# Patient Record
Sex: Male | Born: 1955
Health system: Western US, Academic
[De-identification: ages and names within clinical notes are randomized; demographics above are authoritative.]

---

## 2016-11-14 ENCOUNTER — Inpatient Hospital Stay: Payer: BLUE CROSS/BLUE SHIELD

## 2016-11-14 ENCOUNTER — Telehealth: Payer: BLUE CROSS/BLUE SHIELD

## 2016-11-14 DIAGNOSIS — R55 Syncope and collapse: Secondary | ICD-10-CM

## 2016-11-14 DIAGNOSIS — R030 Elevated blood-pressure reading, without diagnosis of hypertension: Secondary | ICD-10-CM

## 2016-11-14 DIAGNOSIS — R001 Bradycardia, unspecified: Secondary | ICD-10-CM

## 2016-12-19 ENCOUNTER — Ambulatory Visit: Payer: BLUE CROSS/BLUE SHIELD

## 2016-12-25 ENCOUNTER — Ambulatory Visit: Payer: BLUE CROSS/BLUE SHIELD

## 2016-12-25 DIAGNOSIS — E7800 Pure hypercholesterolemia, unspecified: Secondary | ICD-10-CM

## 2016-12-25 DIAGNOSIS — K219 Gastro-esophageal reflux disease without esophagitis: Secondary | ICD-10-CM

## 2016-12-25 DIAGNOSIS — R030 Elevated blood-pressure reading, without diagnosis of hypertension: Secondary | ICD-10-CM

## 2016-12-25 DIAGNOSIS — I5189 Other ill-defined heart diseases: Secondary | ICD-10-CM

## 2016-12-25 DIAGNOSIS — R001 Bradycardia, unspecified: Secondary | ICD-10-CM

## 2016-12-25 DIAGNOSIS — R55 Syncope and collapse: Secondary | ICD-10-CM

## 2018-08-31 ENCOUNTER — Telehealth: Payer: BLUE CROSS/BLUE SHIELD

## 2018-08-31 NOTE — Telephone Encounter
Referred by:     MD Name:  Self   MD Specialty:     Phone:      Diagnosis (must be confirmed by an MD, not per patient):  Essential tremors ( left hand)       Symptoms:  No other symptoms       Diagnostic or Miscellaneous Tests and Date(s) related to this diagnosis:  Does not remember date about 3 years .       Previous Chemo/Radiation Therapy:    []  Yes  [x]  No      Previous Surgery: and Date(s)     []  Yes  ? Date(s):  [x]  No       Medical records related to this diagnosis (both report and CD of imaging required):  (Dr. Joaquim Lai???s office to be excluded from this workflow.)    If appt is within 72 hours -    []  Pt will hand-carry medical records and CD to their appt (they must provide 'copies' as they will not be returned to patient.)     []  Pt will mail medical records and CD (See Clinic Info page for address)    If appt is outside 72 hour window -       []  Pt will e-mail medical records to [email or web address]     []  Pt will mail medical records and CD (See Clinic Info page for address)    []  Pt will deliver medical records and CD to clinic (they must provide 'copies' as they will not be returned to patient.)    []  Pt will send medical records and CD electronically (Patient was provided with the following):    ? Link to [facility] (inform patient that link is only good for 2 days)  ? Check appropriate physician's group, i.e. ''brain tumor group'', ''Pour[facility] group''  ? [employer] fax number [phone].       []  Patient is only established with [facility] and all records are in [facility].    Insurance:   [x]  Insurance verified at time of call in CC.     Authorization required:     []  Yes     []  On file     []  Pending   [x]  No     OTA required:     []  Yes    []  On file    []  Pending   [x]  No     Worker's Comp approval required:     []  Yes    []  On file    []  Pending   [x]  No     []  Insurance not verified at time of call in CC.     []  Pt did not have at time of call; instructed to call back with insurance information.  (inform patient that appt cannot be scheduled until insurance info is obtained and it's  been verified/auth/OTA obtained)   []  Other:    If unable to schedule, select reason:    []  No patient-agreed availability  []  MDs do not support Dx   [x]  Records/Images need MD review   []  Missing records/images  []  Other (Please explain):      Contact person, if other than patient calling  ? Name:    ? Relationship to patient:    ? Phone Number:

## 2018-09-01 NOTE — Telephone Encounter
Pending records to confirm diagnosis.

## 2018-09-23 ENCOUNTER — Ambulatory Visit: Payer: BLUE CROSS/BLUE SHIELD

## 2018-09-23 NOTE — Telephone Encounter
Received neurology initial consult note from visit on 07/24/2014. No confirmed diagnosis of essential tremor or PD. Need notes with confirmed diagnosis. Preferably recent notes, if any.

## 2018-11-18 ENCOUNTER — Ambulatory Visit: Payer: BLUE CROSS/BLUE SHIELD

## 2018-12-12 NOTE — Progress Notes
Neurology New Patient Consult Note    PATIENT:  Frank Schwartz  MRN:  [id number]  DATE: 12/14/2018    Referring Physician: Tye Maryland, MD    Reason for consult:   Chief Complaint   Patient presents with   ??? Tremors     both hands       HPI:   Frank Schwartz is a 63 y.o. right-handed male with  has no past medical history on file.     Frank Schwartz presents to discuss tremor.     -he states he has had tremors since he states in 5th grade.   -he notes tremors started to worsen over past 5-6 years.   -Predominantly in L hand, but also mildly present in R hand. Worse with actions such as holding plates, picking up glasses, bringing spoon/fork to mouth.  Sometimes L hand ''jerks'' randomly when trying to carry something. ETOH seems to relax tremor somewhat. Caffeine seems to exacerbate tremor, as dose lack of sleep, hunger, stress. No rest tremor.    -he has a 78 yr old son and 72 yr old daughter with similar tremors that started in high school.     -Walks, exercises, hikes without limitations. 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 He thinks a few times over the past, has briefly yellout out when waking up from dreams, but no hx of Schwartz persistent or prolonged dream re-enactment behavior. Hx of sleep apnea, wears a mouthpiece.     -Patient saw outside neurologist Dr. 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 Ordered brain MRI. He thinks he had this done, and he thinks was normal.     -He is mainly interested in ultrasound ablation therapy for tremor by [facility] NSG.       Past Medical History  No past medical history on file.  Cardiac diasfunction  Cold urticaria  GERD  Vasovagal syncope 2018, EKG at that time with sinus bradycardia    Past Surgical History  None    Current Medications  Current Outpatient Medications   Medication Sig   ??? Calcium Citrate (CALCITRATE PO) Take by mouth.   ??? desonide 0.05% ointment    ??? Multiple Vitamin (DAILY-VITAMIN PO) Take by mouth. ??? Omega-3 Fatty Acids (FISH OIL PO) Take 1,000 mg by mouth.   ??? pantoprazole 40 mg DR tablet    ??? tadalafil 20 mg tablet take 1 tablet by mouth daily AS NEEDED     No current facility-administered medications for this visit.         No Known Allergies     Family History  No family history on file.    Mother: passed age 105 from breast cancer  Father: does not know him  Younger sister: Healthy  67 yr old son and 40 yr old daughter with similar tremors that started in high school.   57 yr old son, healthy    Social History  Social History     Tobacco Use   ??? Smoking status: Never Smoker   ??? Smokeless tobacco: Never Used   Substance Use Topics   ??? 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     General: well-appearing, well-nourished, appears stated age, NAD, cooperative  HEENT: NCAT, PERRL, anicteric, no oral lesions  CV: RRR, S1/S2, no M/R/G  Pulm: CTAB  Abd: soft, NT/ND, Normal BS  Extrem: no clubbing, cyanosis or edema  Skin: no worrisome rashes    Neurological Exam  Mental Status:   Alert, oriented to person, place, time, situation. Speech spontaneous and fluent, intact comprehension, naming. Adequate fund of knowledge.   Cranial Nerves:  VFF, PERRL, EOMI, facial sensation intact, face symmetric with intact smile, eye closure, eyebrow raise, hearing grossly intact, palate elevates symmetrically, SCM and traps full strength, tongue protrudes midline.  Motor:  Normal bulk. +increased tone in LUE with some cog wheeling with contralateral activation. 5/5 strength throughout bilateral upper and lower extremities. No pronator drift. Finger taps normal.    +L hand postural tremor with outstretched hands, +L>R intention tremor with end pointing. Brief mild overflow tremor into L finger when hands at rest.     Good finger and toe taps    Spiral Drawing      Sensory:  Intact to light touch, pinprick, temperature throughout. No extinction or neglect.  Reflexes:  2+ throughout including biceps, brachioradialis, triceps, patellars, ankles. Downgoing toes bilaterally.  Coordination:  Intact finger-to-nose, heel-to-shin  Gait:  Mild Dec L armswing, otherwise NL casual gait. NL tandem, heel, and toe gait. Romberg negative.     Lab Review:      Other Data  ECG performed on 10/31/2016:  Sinus bradycardia at a rate of 59 bpm.     Assessment:    Frank Schwartz is a 63 y.o. male presenting for tremor. Hx os tremor since childhood, worsening over past 5-10 years, with prominent intention/action component. Two children have similar tremor.     Exam shows L>R postural and intention tremor. Some mild inc tone in LUE and dec L armswing but other parkinsonian signs.   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 Some atypical features are prominently L sided, some subtle parkinsonian signs.   -given he is interested in ultrasound ablation therapy, I recommend he see movement disorders in consult to confirm ET diagnosis before referral to NSG  -he is not interested in medication for tremor at this time. We discussed propranolol, and I would ask him to discuss this with cardiology (already has an appt with them next week) given hx of sinus bradycardia in the past and vasovagal syncope    RTC prn A total of 40 minutes was spent on this encounter, with >50% of the time dedicated to counseling the patient and/or family with regard to the diagnosis, workup, treatment plan and overall prognosis.  Author: Tye Maryland, MD 12/14/2018 12:49 PM

## 2018-12-13 ENCOUNTER — Telehealth: Payer: BLUE CROSS/BLUE SHIELD

## 2018-12-13 NOTE — Telephone Encounter
Called patient and left a voicemail regarding appointment for 12/14/18 at 1:00pm.     PCC: if patient calls back please ask COVID-19 clearance questionnaire and document in appointment notes.     Thank you

## 2018-12-14 ENCOUNTER — Ambulatory Visit: Payer: BLUE CROSS/BLUE SHIELD | Attending: Neurology

## 2018-12-14 DIAGNOSIS — R251 Tremor, unspecified: Secondary | ICD-10-CM

## 2018-12-14 NOTE — Patient Instructions
-  get your labs drawn today  -schedule an appointment with movement disorder neurology clinic  -see the cardiologist and discuss propanolol, given your hx of syncope and low heart rate    Follow up with me as needed. I will contact you through My[facility] portal with lab results

## 2018-12-15 ENCOUNTER — Telehealth: Payer: BLUE CROSS/BLUE SHIELD

## 2018-12-15 NOTE — Telephone Encounter
New Neurology Specialty Intake    MD Name, if provided: n/a    Reason for specialty visit (Diagnosis/Symptom): Tremor     Have you seen a neurologist before?  [x]  Yes  []  No    ? If so, name of Neurologist: Tye Maryland   Are you being referred by a physician?  [x]  Yes  []  No    ? If so, MD name and specialty: Tye Maryland   For MS or Stroke - Were MRI Images Received?  []  Yes  []  No  [x]  N/A    Verified current PCP is listed in CC. Yes     Advised patient to send records. All records are in CC    Please contact pt when placed on wait list to confirm      Patient has been notified of the 24-48 hour turnaround time.

## 2018-12-16 ENCOUNTER — Telehealth: Payer: BLUE CROSS/BLUE SHIELD

## 2018-12-16 DIAGNOSIS — R55 Syncope and collapse: Secondary | ICD-10-CM

## 2018-12-16 DIAGNOSIS — E7800 Pure hypercholesterolemia, unspecified: Secondary | ICD-10-CM

## 2018-12-16 NOTE — Telephone Encounter
Called patient to complete pre screening check in.     Patient does not have BP machine.     Patient reports weight: 218 lbs    Patient is currently taking:   Pantoprazole 40 mg daily   Fish Oil daily   Multivitamin daily   Calcium citrate daily     Patient would like medications sent to [facility] on file.

## 2018-12-16 NOTE — Telephone Encounter
Cardiology Telephone/Telemedicine Visit    PATIENT: Frank Schwartz  MRN: [id number]  DOB: 01-Jul-1956  DATE OF SERVICE: 12/16/2018        Please see my full cardiology consult note from 12/25/2016 for details of patient's cardiac and medical history as well as detailed plan.       Through direct contact with office, patient requested telephone visit given ongoing health concerns surrounding COVID 19. Patient was given the opportunity to come in for an in person visit if there were additional concerns.     REASON FOR TELEPHONE VISIT/CHIEF COMPLAINT:   Chief Complaint   Patient presents with   ??? Appointment       SUBJECTIVE/EVALUATION:  -Patient has not followed up for 2 years. Last visit 12/25/2016  -Recently evaluated by neurology at [facility] for tremors. Tremors also present in son and daughter   -Neurology suggested trial of propranolol and he wants to discuss this further in setting of prior vasovagal syncope  -no recent syncope  -asymptomatic from cardiac perspective   -Exercising daily with no cardiac issues    -BP at time of visit with neurology 124/71 HR 69    Patient does not have BP machine.   ???  Patient reports weight: 218 lbs  ???  Patient is currently taking:   Pantoprazole 40 mg daily   Fish Oil daily   Multivitamin daily   Calcium citrate daily       Echocardiogram 11/18/2016: images personally reviewed  ???1. Technically difficult study. Recommend Definity contrast for future studies.  ???2. Normal left ventricular size wtih normal wall motion .  ???3. Normal left ventricular ejection fraction of approximately 55 to 60%.  ???4. Mild basal septal left ventricular hypertrophy.  ???5. Abnormal LV diastolic function (Grade I).  ???6. The aortic root size is at upper limits of normal.The proximal ascending aorta measures 3.5 mm.  ???7. There is no significant valvular dysfunction.  ???8. There are no prior studies on this patient for comparison purposes.    LABORATORY:  No results found for: WBC, HGB, HCT, MCV, PLT   Lab Results Component Value Date    NA 140 12/14/2018    K 4.0 12/14/2018    CL 101 12/14/2018    CO2 26 12/14/2018    CREAT 0.91 12/14/2018    BUN 18 12/14/2018    GLUCOSE 104 (H) 12/14/2018    No results found for: HGBA1C, HGBA1CPOC   Lab Results   Component Value Date    ALT 16 12/14/2018    AST 17 12/14/2018    ALKPHOS 45 12/14/2018    BILITOT 0.2 12/14/2018     Lab Results   Component Value Date    TSH 1.9 12/14/2018      Lab Results   Component Value Date    CALCIUM 8.7 12/14/2018    No results found for: CHOL, CHOLHDL, CHOLDLCAL, CHOLDLQ, TRIGLY  No results found for: TROPONIN, BNP    Outside Labs 11/12/2016 reviewed  K 4.5  Cr 0.8  LFTS wnl  Hgb 14.8  Plt 271  Vitamin D 40  ???  TC 190  LDL 117  HDL 57  TG 80      ECG performed on 10/31/2016 reviewed and interpreted by me shows:  Sinus bradycardia at a rate of 59 bpm. No significant abnormalities    ASSESSMENT:  * Vasovagal syncope: acute, resolved. 10/2016 after night of drinking significant amount of alcohol and while attempting to have a bowel movement in the morning after getting  out of bed. No recurrence. No history of this.   --Advised hydration and limiting alcohol.   --Transthoracic echocardiogram with no significant structural abnormalities.   * Borderline hypercholesterolemia: chronic, stable. ASCVD risk < 7.5%. Discussed exercise, diet and weight loss. Has lost weight and likely will improve.   --ASCVD 10 year risk of 5.3% on 12/25/2016  * Upper normal aorta: really within normal range when accounting for height  * Sinus bradycardia: chronic, stable. Asymptomatic. Will monitor  * OSA: chronic, stable. Uses mouth fitted device. Stressed weight loss.   * Tremor: being evaluated by neurology    PLAN:  -Ok to use propranolol 10 mg PRN for trimors if needed from cardiovascular perspective. Patient wants to hold off on meds at this time  -will have lipid panel at time of physical coming up and will sent me results  -Echo in the future to monitor at[place]) -RTC in 3-4 months for in person visit. Continue follow up with PMD    TIME: ~ 16 Minutes was spent on this telemedicine visit which included review of recent clinic notes, data review, review of labs, meds as well as imaging as detailed above in addition to counseling and coordination of care. Appropriate orders were placed if indicated.       The above plan of care, diagnosis, orders, and follow-up were discussed with the patient.  Questions related to this recommended plan of care were answered.    Patient was given strict return precautions to call or go to the ER for development of chest pain, significant SOB, bleeding, or any other concerns.           Nayib Remer Elly Modena, MD, [facility]  Assistant Clinical Professor  Division of Cardiology  [facility] Interventional Cardiology  Primary Office:  [address] [place]  [place], [place] [address]  Telephone: [phone] ~ Fax: [phone] ~ pager: [phone]  Secondary Office:  [address], [address]  [place], [place] [address]  Telephone: [phone] ~ Fax: [phone]    Author: Loma Messing I. Uchechukwu Dhawan, MD 12/16/2018 11:24 AM

## 2018-12-16 NOTE — Telephone Encounter
LVM to advise patient has been added to the movement wait list.

## 2018-12-29 NOTE — Telephone Encounter
Pt scheduled by [facility]. No further action required.

## 2018-12-29 NOTE — Telephone Encounter
Lvm for patient to call back and schedule consult w/ Movement program.    If pt calls back, please assist with scheduling PEND NEW with either Dr. Jed Limerick or Dr. Marlene Bast.

## 2019-03-11 ENCOUNTER — Ambulatory Visit: Payer: BLUE CROSS/BLUE SHIELD

## 2019-03-11 ENCOUNTER — Telehealth: Payer: BLUE CROSS/BLUE SHIELD

## 2019-03-11 DIAGNOSIS — R251 Tremor, unspecified: Secondary | ICD-10-CM

## 2019-03-11 NOTE — Consults
MOVEMENT DISORDERS CONSULT NOTE      Date of Service:  03/11/2019  Referring Physician: Tye Maryland, MD      REFERRAL REASON: Tremor evaluation    HPI:  Frank Schwartz is a 63 year-old man referred for evaluation of tremor with interest in focused ultrasound treatment.    Mr. Apostol was first noted to have a mild bilateral upper extremity tremor in 5th grade, when a coach saws his hands shaking during eating. The tremor occurred when he was using his hands only and remained very mild for many years, getting more noticeable when he was nervous, excited, sleep deprived or with caffeine but not interfering with any activities or social interactions. Over the last 5-8 years however, he has noted worsening of the tremor only on the L side such that it is now bothersome.  He has difficulty carrying things in his left hand, for example serving food at a buffet or holding glasses in each hand and some social embarrassment when needing to to these things in public. Fine motor tasks requiring both hands are also more difficult, for example he is slower at buttoning and has difficulty doing something like the clasp on a necklace. He has no trouble with hygeine, feeding, dressing etc as he uses his right hand for these tasks which is minimally affected. Interestingly he notes that the tremor in the L hand is exacerbated often when he concentrates or starts to use his R hand. He doesn't think the R tremor has progressed at all in this time. He denies any voice tremor or head tremor; his wife thinks she may have seen head tremor just a couple times over the years. No tremor in legs, though he reports frequent figidity movements of the legs.     The patient denies any slowing of his movements, stiffness including of the L shoulder, difficulty turning in bed. He and his wife do both spontaneously report that he shuffles more when he walks, such that he is more likely to trip over a crack in the sidewalk than previously, but can normalize his steps with attention. He has not had falls and denies change in balance. Sometimes moans and moves in sleep with ''bouncing'' or ''jerky'' movements, but not clear acting out of dreams. No change in smell, mood or cognition. Does endorses urinary frequency and urgency and erectile dysfunction, but no constipation or regular orthostatic lightheadedness.     Patient was evaluated in [place] several years ago and reports having a normal MRI brain and being diagnosed with essential tremor, but has never tried any medications for the tremor. Recent evaluation by neurologist Dr. Selena Batten was notable for concern for subtle parkinsonism on the L side, including increase LUE tone and decreased arm swing.    PAST MEDICAL HISTORY  Cardiac diasfunction  Cold urticaria  GERD  Vasovagal syncope 2018, EKG at that time with sinus bradycardia    PAST SURGICAL HISTORY  Endoscopy w/ esophageal dilation    MEDICATIONS  Pantoprazole  Cialis prn    ALLERGIES  No Known Allergies    FAMILY HISTORY  Doesn't know biological father. Mother did not have tremor, died late 54s of breast cancer. Half sister with no tremor.  75 yr old son and 31 yr old daughter with similar tremors that started in high school.   33 yr old son, healthy    SOCIAL HISTORY  Health/PE instructor and women's basketball coach.    PHYSICAL EXAM    Vitals: There were no vitals  taken for this visit.     General: No acute distress, appears stated age.   HEENT: Normocephalic, atraumatic. Sclerae anicteric. Mucous membranes moist.  Extremities: No joint swelling or edema.  Skin: No rashes or skin changes.    Neurologic Exam (via telemedicine video):  MS: Alert, oriented person/place/time. Fluent speech, no dysarthria. Naming, comprehension, and repetition intact. Attention intact to serial 7's. Memory intact to recent and remote events.  CN: Full eye movements. Normal saccades. No nystagmus. Face symmetric. No dysarthria or hypomimia. Motor: Unable to assess tone by telemedicine. No rest tremor could be elicited, including with distraction. Postural tremor was also not evident via video in multiple positions. He does have a kinetic tremor 2 on the L and 1 on the R, with spirals and pouring demonstrating intention tremor on the L as well, significantly worse than the R. Finger taps, hand movements and leg movements without clear bradykinesia. Subtly worse L finger taps within range that could be attributed to hand dominance.  Sensory: not assessed  Reflexes: not assessed  Coordination: No dysmetria or dysdiadochokinesia.   Gait/Station: Rises from seated position without push-off. Casual gait as assessed by video is normal. He had overall somewhat diminished arm swing bilaterally, subtly asymmetric with stressed gait decreased arm swing on L>R. Heel strike and stride length were normal.     Bethanie Dicker, [place] Tremor Scale (as possible via video evaluation)  Group A  1. Face - Rest: 0 / Posture: 0  2. Tongue - Rest: 0 / Posture: 0  3. Voice - Action: 0  4. Head - Rest: 0 / Posture: 0  5. RUE - Rest: 0 / Posture: 0 / Action: 2  6. LUE - Rest: 0 / Posture: 0 / Action: 1  7. Trunk - Rest: 0 / Posture: 0  8. RLE - Rest: 0 / Posture: 0 / Action: 0  9. LLE - Rest: 0 / Posture: 0 / Action: 0      Subtotal: 3     Group B  10. Handwriting - 0  11. Drawing A: R 0 / L 1  12. Drawing B - R x / L x  13. Drawing C - R x / L x  14. Pouring - R 0 / L 1  Subtotal: incomplete Group C  15. Speaking - 0  16. Feeding - 0/2  17. Drinking - 0/1  18. Hygiene - 0  19. Dressing - 1  20. Writing - 0  21. Working - 0  22. Key in lock - 0  Subtotal: 1 (uses unaffected R hand; 4 wrt to L hand)    TOTAL: incomplete               DATA  TSH, CMP normal    MRI reportedly normal in [place] years ago. No records available.    ASSESSMENT  Frank Schwartz is a 63 year-old man referred for evaluation of tremor with interest in focused ultrasound treatment. His history is notable for mild bilateral upper extremity tremor relatively stable since childhood, followed by about 5-8 years of worsening LUE action tremor in the absence of any R sided tremor worsening. Also has family history of tremor in 2 children. Exam is limited over video, however notable for L>R intention tremor and no rest or postural tremor that could be brought out today (overall tremor is milder than when he saw Dr. Selena Batten based on spiral comparison, not unusual for tremor to fluctuate over time). Subtle Parkinsonism is difficult  to assess over video, but his finger taps were slightly dysrhythmic on the L and there may have been decreased arm swing with stressed gait on the L as well. No cerebellar signs. Agree that marked asymmetry (demonstrated more clearly in 12/14/18 note by Dr. Selena Batten) is atypical for essential tremor and it is certainly possible that this represents a mixed disorder with new onset of Parkinsonism in more recent years. The tremor is mildly disabling for certain tasks requiring both hands, however he has not tried medical management and only the non-dominant hand is signfiicantly affected by tremor. We therefore recommended pursuing medical therapies before considering invasive therapies such as FUS or DBS regardless of the diagnosis. Patient is amenable to this approach, and defers initiation of medications until after further diagnostic testing.    RECOMMENDATIONS  - DAT scan at [facility] to evaluate for PD vs Essential tremor  - if DAT scan negative, warrants brain MRI w and w/o contrast to rule out any slow growing lesion given my exam is limited primarily to a unilateral action tremor  - Occupational Therapy referral for tremor   - Patient defers medications until work-up complete, will consider sinemet, primidone trials pending results.  - FUS may be considered in the future depending on further diagnostic clarity and trial of medical management      RTC 2-3 months after studies Patient seen and examined at bedside with Movement Disorders attending, Dr. Erline Hau, MD PhD  [facility] Fellow    I participated in this virtual visit and concur with Dr. Leonides Sake assessment. I reinforced that the tremor which is functionally disabling should be treated, in this case the action tremor not possible parkinsonism. I would go with a trial of primidone as it does not usually exacerbate ED or lower mood. Strength exercises for the upper extremities are likely to be quite helpful as well. Mr. Romanoski will hear from [place] when we get the results of the DATscan and we would like to receive a hard copy of the scan. I spent 15 minutes in this consultation.Britt Bolognese, MD

## 2019-04-22 ENCOUNTER — Ambulatory Visit: Payer: BLUE CROSS/BLUE SHIELD

## 2019-04-29 ENCOUNTER — Ambulatory Visit: Payer: BLUE CROSS/BLUE SHIELD

## 2019-04-29 DIAGNOSIS — I5189 Other ill-defined heart diseases: Secondary | ICD-10-CM

## 2019-04-29 DIAGNOSIS — R55 Syncope and collapse: Secondary | ICD-10-CM

## 2019-04-29 DIAGNOSIS — E7800 Pure hypercholesterolemia, unspecified: Secondary | ICD-10-CM

## 2019-04-29 DIAGNOSIS — R001 Bradycardia, unspecified: Secondary | ICD-10-CM

## 2019-04-29 DIAGNOSIS — G4733 Obstructive sleep apnea (adult) (pediatric): Secondary | ICD-10-CM

## 2019-04-29 NOTE — Consults
Outpatient Cardiology Follow Up    PATIENT: Frank Schwartz  MRN: [id number]  DOB: 03/25/1956  DATE OF SERVICE: 04/29/2019      PRIMARY CARE PROVIDER: Tomi Likens., MD    REFERRING PHYSICIAN:  Tomi Likens., MD      REASON FOR CONSULTATION: elevated blood pressure      HISTORY OF PRESENT ILLNESS:  Frank Schwartz is a 63 y.o. male with history of GERD and OSA who was initially seen by me on 10/31/2016 for evaluation of elevated blood pressure and to establish cardiovascular care.     Per my initial note: ''Starting early 2018,  he started to feel intermittent neck pain. Usually associated with game days (increased stress). Also concerned because blood pressure has been slightly elevated in the 130-140 at MDs office. BP has been in 120s for years. No associated symptoms. Asymptomatic from cardiovascular perspective. Has gained about 20 lbs since 04/2016. Does feel flushed at times during the games.  2 weeks ago he had a fall when getting out of bed early in the morning. Was at a hotel. Was on the toilet having a bowel movement. Then found himself on the floor. Did hit his head. His wife woke him up. Had a laceration on his forehead. Had 10 beers from 5 pm to midnight the night before. Saw Dr. Valentina Lucks. No headaches since. Has not had a history of this before.''    SUBJECTIVE/INTERVAL EVENTS:  -Last in person visit 12/25/2016. Has not followed up for 2 years. Phone encounter 12/16/18  -No acute cardiac issues   -Recently evaluated by neurology at [facility] for tremors. Tremors also present in son and daughter   -Neurology suggested trial of propranolol and we discussed this further via phone 12/16/18  in setting of prior vasovagal syncope. Advised it was ok to start low dose   -no recent syncope  -asymptomatic from cardiac perspective   -Exercising daily with no cardiac issues. Recently was swimming 100 laps daily with 15-20 K steps    -Has lost some weight. Cut down on drinking beer. Also more strict about diet.   No syncope. Patient denies any chest pain, shortness of breath, or significant dyspnea on exertion. No paroxysmal nocturnal dyspnea or orthopnea. No palpitations, syncope or presyncope. No significant lower extremity edema. No fevers or chills. No bleedings issues.     EXERCISE CAPACITY: Patient has good exercise tolerance. Patient exercises by lifting weights and ellyptical 3X per week (75 minutes; 20 minutes of ellyptical); walks dogs twice daily (can be up to one hour). He denies any significant chest pain with exertion.     CARDIAC RISK FACTORS:  [x]  age     [x]  male gender      []  hypertension    []  diabetes [] hypercholesterolemia     []  tobacco  []  family history    CARDIAC SYMPTOMS:  []  chest pain []  shortness of breath [] orthopnea []  PND []  edema      []  palpitations []  dizziness []  near syncope [] syncope.         PAST MEDICAL HISTORY:   No past medical history on file.   GERD  Essential termor  Borderline hypercholesterolemia     PAST SURGICAL HISTORY:   No past surgical history on file.   No surgeries    OUTPATIENT MEDICATIONS:  Current Outpatient Medications   Medication Sig   ? Calcium Citrate (CALCITRATE PO) Take by mouth.   ? desonide 0.05% ointment    ? Multiple Vitamin (DAILY-VITAMIN PO) Take by mouth.   ?  Omega-3 Fatty Acids (FISH OIL PO) Take 1,000 mg by mouth.   ? pantoprazole 40 mg DR tablet    ? tadalafil 20 mg tablet take 1 tablet by mouth daily AS NEEDED     No current facility-administered medications for this visit.    PPI daily  Cialis PRN      ALLERGIES: NKDA  No Known Allergies    SOCIAL HISTORY: Reviewed with patient  No significant tobacco use or illicit drug use.   Social occasional alcohol use. But can vary. Currently 12 beers per week    Works as [employer]     FAMILY HISTORY: Reviewed with patient  No family history on file.  Father: does not know him   Mother: no heart issues  Siblings: no heart issues    REVIEW OF SYSTEMS: Review of 12 systems was negative other than noted pertinent positives in HPI. Additionally, no fevers or chills. No bleeding. No hematuria or melena.     PHYSICAL EXAMINATION:  VITALS: BP 110/73  ~ Pulse 59  ~ Temp 36.1 ?C (96.9 ?F)  ~ Wt 219 lb (99.3 kg)  ~ SpO2 95%  ~ BMI 28.12 kg/m?    General: well developed well nourished male in no acute distress  Eyes: no conjunctival pallor, anicteric, extraocular movements intact,  ENT: hearing intact, moist mucous membranes  Neck: supple, trachea at midline  Heart: regular rate and rhythm, normal S1, S2. no rubs, murmurs, or gallops. No thrills. JVP flat  Lungs: clear to auscultation bilaterally, no retractions. Good effort  Abd: soft, non tender, non distended, normoactive bowel sounds,   Ext: no clubbing, cyanosis, or edema  Skin: warm, dry , and well perfused  Neuro: grossly non focal. AAO X 3  Psych: appropriate mood and affect        LABORATORY:  No results found for: WBC, HGB, HCT, MCV, PLT   Lab Results   Component Value Date    NA 140 12/14/2018    K 4.0 12/14/2018    CL 101 12/14/2018    CO2 26 12/14/2018    CREAT 0.91 12/14/2018    BUN 18 12/14/2018    GLUCOSE 104 (H) 12/14/2018    No results found for: HGBA1C, HGBA1CPOC   Lab Results   Component Value Date    ALT 16 12/14/2018    AST 17 12/14/2018    ALKPHOS 45 12/14/2018    BILITOT 0.2 12/14/2018     Lab Results   Component Value Date    TSH 1.9 12/14/2018      Lab Results   Component Value Date    CALCIUM 8.7 12/14/2018    No results found for: CHOL, CHOLHDL, CHOLDLCAL, CHOLDLQ, TRIGLY  No results found for: TROPONIN, BNP    Outside Labs 11/12/2016 reviewed  K 4.5  Cr 0.8  LFTS wnl  Hgb 14.8  Plt 271  Vitamin D 40    TC 190  LDL 117  HDL 57  TG 80      DIAGNOSTIC STUDIES:      ECG performed on 04/29/19 reviewed and interpreted by me shows:  Sinus bradycardia. No significant abnormalities    ECG performed on 10/31/2016 reviewed and interpreted by me shows:  Sinus bradycardia at a rate of 59 bpm. No significant abnormalities.     Echocardiogram 11/18/2016: images personally reviewed   1. Technically difficult study. Recommend Definity contrast for future studies.   2. Normal left ventricular size wtih normal wall motion .   3.  Normal left ventricular ejection fraction of approximately 55 to 60%.   4. Mild basal septal left ventricular hypertrophy.   5. Abnormal LV diastolic function (Grade I).   6. The aortic root size is at upper limits of normal.The proximal ascending aorta measures 3.5 mm.   7. There is no significant valvular dysfunction.   8. There are no prior studies on this patient for comparison purposes.    2018 ACC/AHA guidelines recommends that patient is not in statin benefit group. Encourage adherence to heart-healthy lifestyle.    10-year ASCVD risk  cannot be calculated because at least one required variable is not available in [facility]  as of 1:06 PM on 04/29/2019  10-year ASCVD risk with optimal risk factors is 5.5%.  Values used to calculate ASCVD score:  Age: 63 y.o.   Gender: Male Race: Not African American.  Cannot calculate risk because HDL cholesterol not documented within the past 5 years.    Cannot calculate risk because Total cholesterol not documented within the past 5 years.    LDL cholesterol not documented within the past 5 years.    Systolic BP: 110 mm Hg. BP was measured today.  The patient is not being treated with a medication that influences SBP.  The patient is currently not a smoker.  The patient does not have a diagnosis of diabetes.    Click here for the [facility] ASCVD Cardiovascular Risk Estimator Plus tool Office manager).        ASSESSMENT:  Traver Meckes is a 63 y.o. male with:    * Vasovagal syncope: acute, resolved. 10/2016 after night of drinking significant amount of alcohol and while attempting to have a bowel movement in the morning after getting out of bed. No recurrence. No prior history of this. --Advised hydration and limiting alcohol.   --Transthoracic echocardiogram?with no significant structural abnormalities.  * Elevated blood pressure: subacute, improved. Now at goal after lifestyle changes.   --Was previously likely secondary to weight gain of ~ 20 lbs since 04/2016 with likely worsening baseline OSA. Has lost weight since   * Mild diastolic dysfunction: on echo. Likely secondary more elevated BP during periods of weight gain and OSA.   --Optimal BP control.   * Borderline hypercholesterolemia: chronic, stable. ASCVD risk < 7.5%. Discussed exercise, diet and weight loss. Has lost weight and likely will improve.   --ASCVD 10 year risk of 5.3% on 12/25/2016  * Upper normal aorta: really within normal range when accounting for height  * Sinus bradycardia: new, on ECG. Asymptomatic. Will monitor  * OSA: chronic, stable. Uses mouth fitted device. Stressed weight loss.   * GERD: chronic, stable.  * Tremors: followed by neurology. On propranolol     PLAN:  -Reviewed labs, Echo and ECG with patient. Discussed implications.  He is doing well and remains asymptomatic from a cardiovascular perspective  -Ok to use propranolol 10 mg PRN for tremors if needed from cardiovascular perspective. Patient wants to hold off on meds at this time  -Previously informed patient that increase in BP was likely secondary to weight grain of about 20 lbs since 04/2016.Has lost weight since  and BP is now at goal. Has also reduced alcohol  -Discussed importance of diet and weight loss  -Advised low sodium diet  -Advised hydration. Instructed patient to limit alcohol intake  -Counseled patient on importance of diet and exercise and encouraged adherence to a heart-healthy diet.  -Transthoracic echocardiogram at time of follow up to monitor aorta   -  RTC in 1 year or sooner if needed. Continue follow up with PMD    The above plan of care, diagnosis, orders, and follow-up were discussed with the patient.  Questions related to this recommended plan of care were answered.  Greater than 50% of the 26 minute encounter was spent in counseling and coordination of care.     Patient was given strict return precautions to call or go to the ER for development of chest pain, significant SOB, bleeding, or any other concerns.     Thank you Dr. Valentina Lucks for allowing me to participate in the care of your patient. Please feel free to contact me with any questions or concerns.       Dayan Kreis Elly Modena, MD, [facility]  Assistant Clinical Professor  Division of Cardiology  [facility] Interventional Cardiology  [address]  [place], [place] [address]  Telephone: [phone] ~ Fax: [phone] ~ pager: [phone]        Author: Loma Messing I. Suheily Birks, MD 04/29/2019 1:06 PM

## 2019-04-29 NOTE — Patient Instructions
Ok to use propranolol 10 mg as needed for tremors    Please hydrate aggressively

## 2019-05-05 ENCOUNTER — Ambulatory Visit: Payer: BLUE CROSS/BLUE SHIELD

## 2019-05-11 ENCOUNTER — Telehealth: Payer: BLUE CROSS/BLUE SHIELD

## 2019-05-11 NOTE — Telephone Encounter
L/V regarding to call back to schedule the echo test.

## 2019-06-15 ENCOUNTER — Telehealth: Payer: BLUE CROSS/BLUE SHIELD

## 2019-06-15 NOTE — Telephone Encounter
Call Back Request    MD:  Dr. Elinor Parkinson, Cristal Ford     Reason for call back: Patient calling to schedule follow up appointment with doctor.    Please advise, thank you.  CB # [id number]    Any Symptoms:  []  Yes  [x]  No      ? If yes, what symptoms are you experiencing:    o Duration of symptoms (how long):    o Have you taken medication for symptoms (OTC or Rx):      Patient or caller has been notified of the 24-48 hour turnaround time.

## 2019-06-17 NOTE — Telephone Encounter
PDL Call to Practice    Reason for Call: Pt called back stating Dr. Elinor Parkinson told him she is out of the office and to schedule appt with another MD  MD: Dr. Elinor Parkinson    Appointment Related?  [x]  Yes  []  No     If yes;  Date:  Time:    Call warm transferred to PDL: [x]  Yes  []  No    Call Received by Practice Representative:Joshua

## 2019-06-29 ENCOUNTER — Telehealth: Payer: BLUE CROSS/BLUE SHIELD

## 2019-06-29 NOTE — Telephone Encounter
Patient walked into clinic today dropping off an MRI disc, uploaded and was dropped off in coordinators box. Patient doesn't need disc back. Offered to mail back he stated it was not necessary.

## 2019-06-30 ENCOUNTER — Telehealth: Payer: BLUE CROSS/BLUE SHIELD

## 2019-06-30 ENCOUNTER — Telehealth: Payer: BLUE CROSS/BLUE SHIELD | Attending: Neurology

## 2019-06-30 DIAGNOSIS — R251 Tremor, unspecified: Secondary | ICD-10-CM

## 2019-06-30 NOTE — Patient Instructions
See progress notes for details

## 2019-06-30 NOTE — Telephone Encounter
Lm reminding of vv/ I will call back for chart intake closer to appt time/dm

## 2019-06-30 NOTE — Progress Notes
No vital signs were taken due to the video consultation format. Laramie was readily seen and heard on camera and managed it on his own.    We discussed the results of his recent DAT scan at [facility] which showed ''Mildly decreased putaminal activity bilaterally, slightly more prominent on the left. Caudate activity was normal''. This is not consistent with his clinical picture of mild action tremor in both hands since childhood and recent increase in tremor on the LUE with action/intention features. He has not noted a rest tremor in his hands or legs, no lip quiver or head bobbing and no vocal tremor. He denies diplopia, drooling, loss of smell, incoordination, near misses or falls, stiffness or slowness, postural symptoms, bladder or bowel problems, blue hands or feet, numbness in his toes or digits, cognitive difficulties or mood changes. He does note pseudobulbar affect (mostly tearfulness with emotional material) and sloppiness when holding things in two hands. He can manage a beverage with his left hand if his right hand is free. He is right hand dominant and his handwriting has not changed. He is not having problems with his ADLs or IADLs. He exercises vigorously with walking dogs 2 hours a day, cardio videos and weight lifting. 3 days/week. He wears an oromandibular advancement device he got several years ago and his snoring has decreased per his wife. He has rare RBD. On examination, Shameer has an animated face without tongue tremor, normal EOMS without nystagmus and no overshoot or hypometric saccades. He has normal transfers, stride and posture, good arm swings and no flapping tremor when walking. Romberg is negative and he can balance/bend over. His finger taps and grips are intact on both sides as well as RAM and overhead reach. He has o visible neck dystonia but slight restriction of rotation to the left compared to the right, normal extension and flexion. He has no drift, minimal intention tremor left FTN and no foot tremors or difficulty with taps and rotation. His knee extension is steady bilaterally. He does not have trouble with simple calculation, sequencing and short term memory. He is not overtly stressed or depressed and denies hallucinations.    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

## 2019-08-17 ENCOUNTER — Telehealth: Payer: BLUE CROSS/BLUE SHIELD

## 2019-08-17 NOTE — Telephone Encounter
I left a voicemail to have him call back and get a hard copy of his DATscan sent to me.

## 2019-10-05 ENCOUNTER — Ambulatory Visit: Payer: BLUE CROSS/BLUE SHIELD

## 2019-10-07 ENCOUNTER — Ambulatory Visit: Payer: BLUE CROSS/BLUE SHIELD

## 2019-10-07 DIAGNOSIS — Z23 Encounter for immunization: Secondary | ICD-10-CM

## 2020-04-30 ENCOUNTER — Ambulatory Visit: Payer: BLUE CROSS/BLUE SHIELD

## 2020-06-28 ENCOUNTER — Ambulatory Visit: Payer: BLUE CROSS/BLUE SHIELD | Attending: Neurology

## 2020-06-29 ENCOUNTER — Ambulatory Visit: Payer: BLUE CROSS/BLUE SHIELD | Attending: Neurology

## 2020-07-04 DIAGNOSIS — R251 Tremor, unspecified: Secondary | ICD-10-CM

## 2020-07-05 NOTE — Patient Instructions
See progress notes for details

## 2020-07-05 NOTE — Progress Notes
Frank Schwartz presents on his own after a one year hiatus with a telemedicine visit last November. He has been vaccinated and did not contract COVID. He reports no changes in gait, balance, stamina or bowel/bladder function, no RBD, anxiety or depression. He walks the dogs 10 miles a day and is now wearing a bite guard for clenching from his dentist He does snore a little but has no arousals or sleepiness in the daytime. His tremors increase in intensity when he is excited and moreso in the morning on both sides after a brief period of quiet hands on awakening. His daytime tremor is usually just the left hand when he is using it and does not impact his ADLs or IADLs. He denies any head bobbing, vocal quaver or strangulation, arrhythmias or postural symptoms. He is able to turn pages of a book and manage buttons and zippers.    On examination Frank Schwartz has normal posture, gait and speed of walking. His arm swings are symmetric without posturing. He has a grade 2 spiral on the right and 2.5 on the left. His script signature is large and scribbly but print is smooth and well sustained. He does not have any pronator drift or dystonic posturing, no spasticity or focal weakness on resistance testing. His eye movements are smooth and conjugate without end gaze nystagmus. His tongue is quiet on protrusion and he has no neck displacement, strap muscle hypertrophy or paraspinal spasm/scoliosis. His FTN is 1+ without dysmetria on the L, tr on the R and he has no problem with RAM. His HTS is smooth and fast. He can do finger taps and foot taps readily but not with a sustained rhythm. DTRs are normoactive with down going toes. No proprioceptive or vibratory deficits are noted.    Impression:  1) Essential tremor phenomenologically with unusual course over 50 years but little progression  2) No parkinsonism to date  3) Jaw clenching and snoring, possible OSA but without sleep study documentation      Recommendations:  1) Consider doing a polysomnogram if snoring worsens or tremors escalate as that could be due to obstructive sleep apnea  2) No specific therapy indicated for tremors currently but demonstrated 'anchoring' strategies for hand coordination and carrying objects  Follow up in 1 year. I spent 25 minutes in this visit and documentation.

## 2021-06-26 ENCOUNTER — Ambulatory Visit: Payer: BLUE CROSS/BLUE SHIELD | Attending: Neurology

## 2023-12-02 IMAGING — MR MULTI PARAMETRIC MRI PROSTATE W/O AND W CONTRAST
11 series · 48 of 48 positions shown · IV contrast (gadavist)
Comparison: None

________________________________________________________________________________________________ 
MULTI PARAMETRIC MRI PROSTATE W/O AND W CONTRAST, 12/02/2023 [DATE]: 
CLINICAL INDICATION: Elevated Prostate Specific Antigen [psa]
TECHNIQUE: Multiple parametric sequences were performed Pre-contrast: T1 axial 
of the entire pelvis. T2 sagittal, axial  and coronal,T1 axial, acquired of the 
prostate. Diffusion with multiple B values of 7999, 6966 calculated ADC value 
for mapping. Post contrast: Rapid sequence dynamic and axial planes through the 
prostate and seminal vesicles,T1 axial with fat sat of the entire pelvis. 3-D 
renderings were reconstructed on an independent workstation. The images were 
also evaluated with Dyna CAD computer aided detection. 10 mL of Gadavist were 
injected intravenously. As per [HOSPITAL] guidelines 3D 
reconstructions are performed with concurrent physician supervision.

[Series 201: survey-mst · axial · 10.0mm · 1.34mm/px · 1 of 14 slices shown]
[im 1/14]
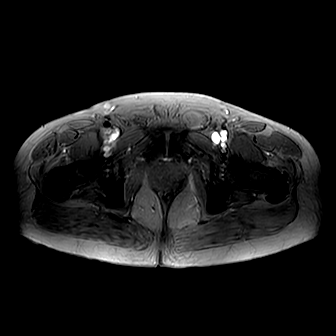

[Series 301: t1w_tse_ax · axial · 6.0mm · 0.37mm/px · 1 of 36 slices shown]
[im 1/36]
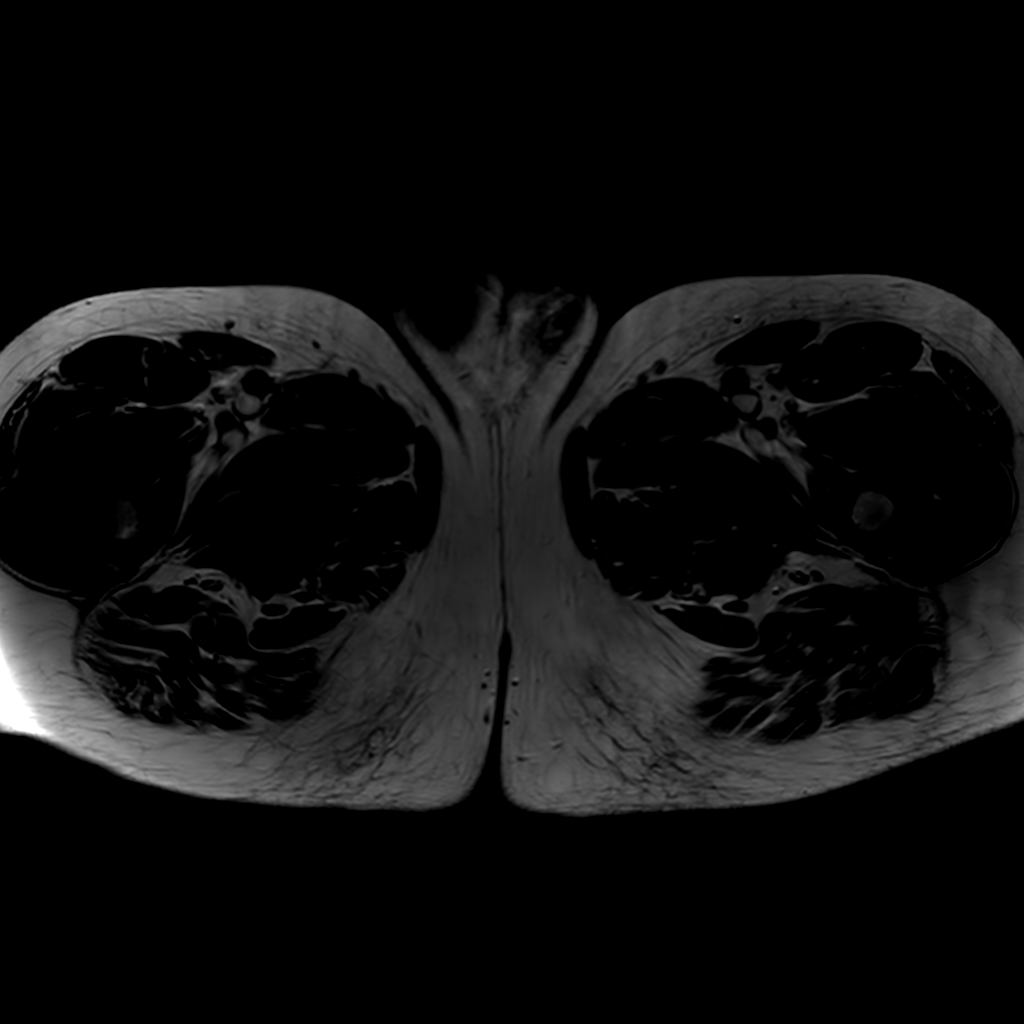

[Series 401: t2w cor · coronal · 3.0mm · 0.42mm/px · 1 of 30 slices shown]
[im 1/30]
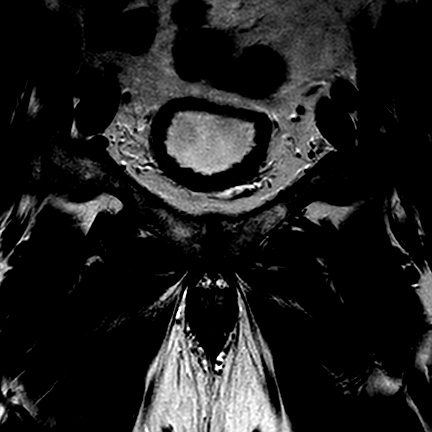

[Series 501: t2w ax · axial · 3.0mm · 0.38mm/px · 1 of 32 slices shown]
[im 1/32]
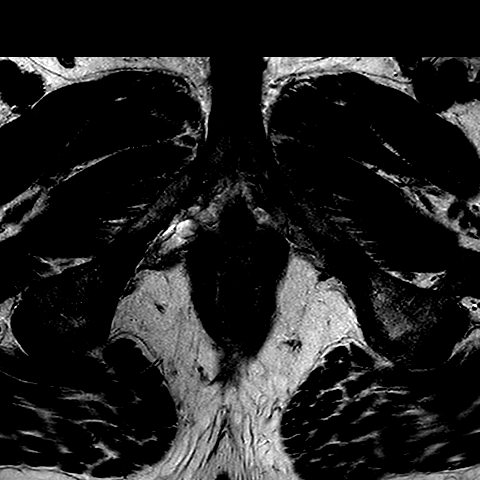

[Series 601: new-dwi_3b* 3mm* · axial · 3.0mm · 1.28mm/px · 1 of 64 slices shown]
[im 1/64]
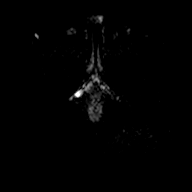

[Series 602: ADC · axial · 3.0mm · 1.28mm/px · 1 of 31 slices shown (1 of 2)]
[im 1/31]
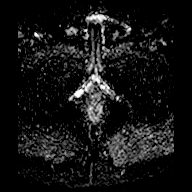

[Series 603: ADC · axial · 3.0mm · 1.28mm/px · 1 of 30 slices shown (2 of 2)]
[im 1/30]
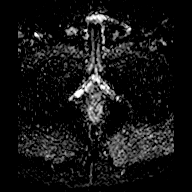

[Series 604: (id) · axial · 3.0mm · 1.28mm/px · 1 of 32 slices shown (1 of 3)]
[im 1/32]
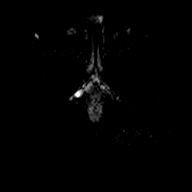

[Series 605: (id) · axial · 3.0mm · 1.28mm/px · 1 of 32 slices shown (2 of 3)]
[im 1/32]
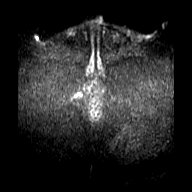

[Series 701: (id) · axial · 3.0mm · 1.28mm/px · 1 of 32 slices shown (3 of 3)]
[im 1/32]
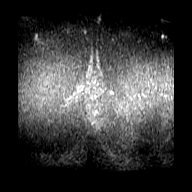

[Series 901: dyn 3mm*(ap) · axial · 3.0mm · 1.38mm/px · z∈[-182,-89]mm · 38 of 1440 slices shown]
[im 1/1440]
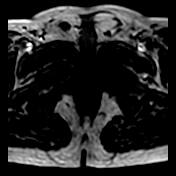
[im 39/1440]
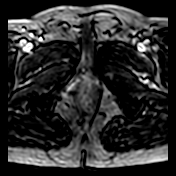
[im 78/1440]
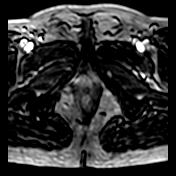
[im 117/1440]
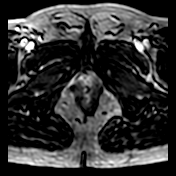
[im 156/1440]
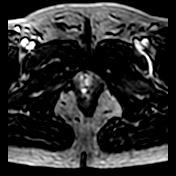
[im 195/1440]
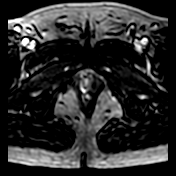
[im 234/1440]
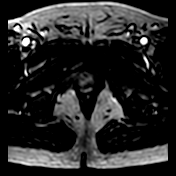
[im 273/1440]
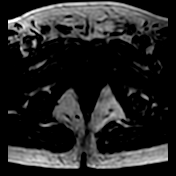
[im 312/1440]
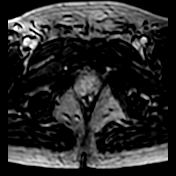
[im 351/1440]
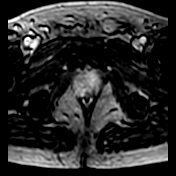
[im 389/1440]
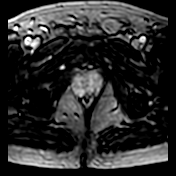
[im 428/1440]
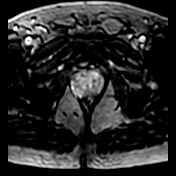
[im 467/1440]
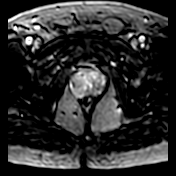
[im 506/1440]
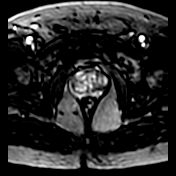
[im 545/1440]
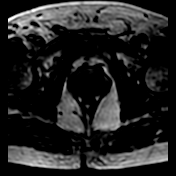
[im 584/1440]
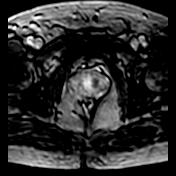
[im 623/1440]
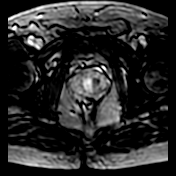
[im 662/1440]
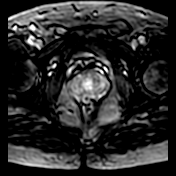
[im 701/1440]
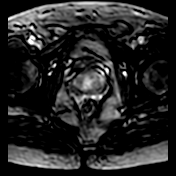
[im 739/1440]
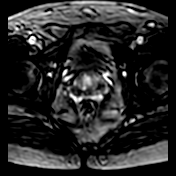
[im 778/1440]
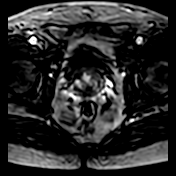
[im 817/1440]
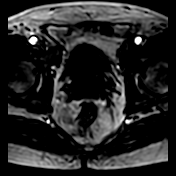
[im 856/1440]
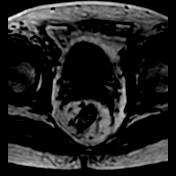
[im 895/1440]
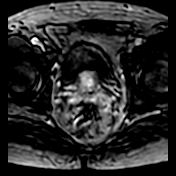
[im 934/1440]
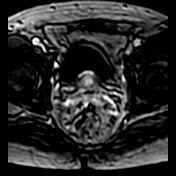
[im 973/1440]
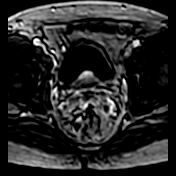
[im 1012/1440]
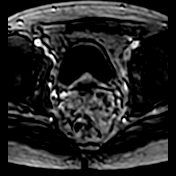
[im 1051/1440]
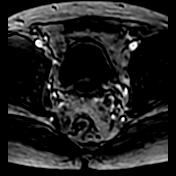
[im 1089/1440]
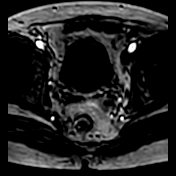
[im 1128/1440]
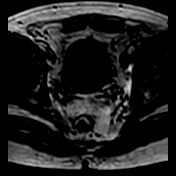
[im 1167/1440]
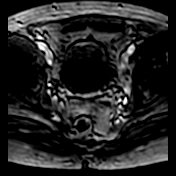
[im 1206/1440]
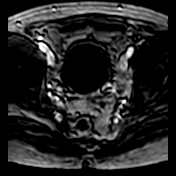
[im 1245/1440]
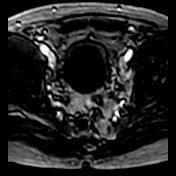
[im 1284/1440]
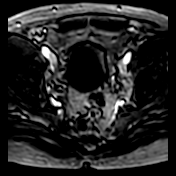
[im 1323/1440]
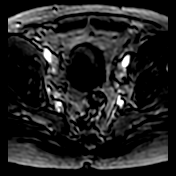
[im 1362/1440]
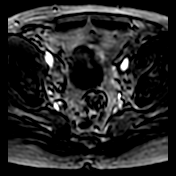
[im 1401/1440]
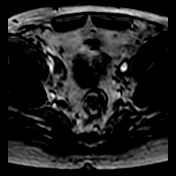
[im 1440/1440]
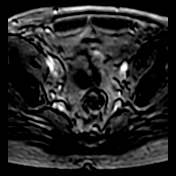

[48 of 48 positions shown; findings below may reference images not displayed]

FINDINGS: VOLUME: 60 cc 
CENTRAL GLAND: Enlarged, heterogeneous in appearance and nodular in appearance. 
Elevates the bladder base. There is some asymmetric decreased T2 signal in the 
far left lateral aspect of the transition zone measuring up to 1.9 cm on axial 
image 14 showing decreased signal on ADC and mild diffusion asymmetry. No 
additional suspicious abnormality seen within the transition zone or central 
gland. 
PERIPHERAL ZONE: Much more concerning lesion is seen within the right posterior 
lateral peripheral zone between the apex and mid gland. On T2 axial image 9 
measures 1.3 cm. Does show decreased signal on ADC and shows significant 
restricted diffusion. No additional areas of restricted diffusion identified. 
PROSTATE CAPSULE: The lesion described above does abut the capsule without 
extracapsular extension identified. 
MUSCLE SIDE WALLS: Normal in appearance. 
SEMINAL VESICLES: Normal in appearance. 
BLADDER: Mildly thickened and trabeculated. 
LYMPHADENOPATHY: No suspicious abnormality identified. 
BONES: No osseous lesions identified. 
ADDITIONAL FINDINGS: None
IMPRESSION: 2 areas of concern. The most concerning the right posterior lateral peripheral 
zone between the apex and mid gland level as above. The area within the central 
gland is somewhat less specific located to the left of midline just above the 
mid gland level. 
(PI-RADS 4): High (clinically significant cancer is likely to be present).
